# Patient Record
Sex: Male | Born: 1985 | Hispanic: Yes | Marital: Married | Smoking: Former smoker
Health system: Southern US, Community
[De-identification: ages and names within clinical notes are randomized; demographics above are authoritative.]

## PROBLEM LIST (undated history)

## (undated) DIAGNOSIS — E781 Pure hyperglyceridemia: Secondary | ICD-10-CM

---

## 2016-09-25 ENCOUNTER — Encounter (HOSPITAL_COMMUNITY)
Admission: EM | Disposition: A | Discharge status: Home / Self Care | Payer: Self-pay | Source: Home / Self Care | Attending: Emergency Medicine

## 2016-09-25 ENCOUNTER — Inpatient Hospital Stay (HOSPITAL_COMMUNITY): Payer: BLUE CROSS/BLUE SHIELD

## 2016-09-25 ENCOUNTER — Inpatient Hospital Stay (HOSPITAL_COMMUNITY): Payer: BLUE CROSS/BLUE SHIELD | Admitting: Anesthesiology

## 2016-09-25 ENCOUNTER — Emergency Department (HOSPITAL_COMMUNITY): Payer: BLUE CROSS/BLUE SHIELD

## 2016-09-25 ENCOUNTER — Encounter (HOSPITAL_COMMUNITY): Payer: Self-pay | Admitting: *Deleted

## 2016-09-25 ENCOUNTER — Observation Stay (HOSPITAL_COMMUNITY)
Admission: EM | Admit: 2016-09-25 | Discharge: 2016-09-26 | Disposition: A | Discharge status: Home / Self Care | Payer: BLUE CROSS/BLUE SHIELD | Attending: General Surgery | Admitting: General Surgery

## 2016-09-25 DIAGNOSIS — Z87891 Personal history of nicotine dependence: Secondary | ICD-10-CM | POA: Insufficient documentation

## 2016-09-25 DIAGNOSIS — E781 Pure hyperglyceridemia: Secondary | ICD-10-CM | POA: Diagnosis not present

## 2016-09-25 DIAGNOSIS — K3580 Unspecified acute appendicitis: Principal | ICD-10-CM | POA: Diagnosis present

## 2016-09-25 DIAGNOSIS — K353 Acute appendicitis with localized peritonitis, without perforation or gangrene: Secondary | ICD-10-CM

## 2016-09-25 DIAGNOSIS — J810 Acute pulmonary edema: Secondary | ICD-10-CM

## 2016-09-25 HISTORY — DX: Pure hyperglyceridemia: E78.1

## 2016-09-25 HISTORY — PX: LAPAROSCOPIC APPENDECTOMY: SHX408

## 2016-09-25 LAB — COMPREHENSIVE METABOLIC PANEL
ALBUMIN: 4.6 g/dL (ref 3.5–5.0)
ALT: 49 U/L (ref 17–63)
ANION GAP: 10 (ref 5–15)
AST: 76 U/L — AB (ref 15–41)
Alkaline Phosphatase: 59 U/L (ref 38–126)
BUN: 7 mg/dL (ref 6–20)
CO2: 24 mmol/L (ref 22–32)
Calcium: 9.6 mg/dL (ref 8.9–10.3)
Chloride: 102 mmol/L (ref 101–111)
Creatinine, Ser: 1.11 mg/dL (ref 0.61–1.24)
GFR calc Af Amer: >60 mL/min (ref >60)
Glucose, Bld: 126 mg/dL — ABNORMAL HIGH (ref 65–99)
POTASSIUM: 4.1 mmol/L (ref 3.5–5.1)
Sodium: 136 mmol/L (ref 135–145)
Total Bilirubin: 0.6 mg/dL (ref 0.3–1.2)
Total Protein: 7.6 g/dL (ref 6.5–8.1)

## 2016-09-25 LAB — LIPID PANEL
Cholesterol: 206 mg/dL — ABNORMAL HIGH (ref 0–200)
HDL: 48 mg/dL (ref >40)
LDL CALC: 124 mg/dL — AB (ref 0–99)
TRIGLYCERIDES: 169 mg/dL — AB (ref <150)
Total CHOL/HDL Ratio: 4.3 RATIO
VLDL: 34 mg/dL (ref 0–40)

## 2016-09-25 LAB — CBC
HEMATOCRIT: 42.9 % (ref 39.0–52.0)
HEMOGLOBIN: 13.8 g/dL (ref 13.0–17.0)
MCH: 25.8 pg — ABNORMAL LOW (ref 26.0–34.0)
MCHC: 32.2 g/dL (ref 30.0–36.0)
MCV: 80.2 fL (ref 78.0–100.0)
Platelets: 207 10*3/uL (ref 150–400)
RBC: 5.35 MIL/uL (ref 4.22–5.81)
RDW: 14.1 % (ref 11.5–15.5)
WBC: 11.6 10*3/uL — AB (ref 4.0–10.5)

## 2016-09-25 LAB — LIPASE, BLOOD: LIPASE: 18 U/L (ref 11–51)

## 2016-09-25 SURGERY — APPENDECTOMY, LAPAROSCOPIC
Anesthesia: General | Site: Abdomen

## 2016-09-25 MED ORDER — SODIUM CHLORIDE 0.9 % IR SOLN
Status: DC | PRN
  Administered 2016-09-25: 1

## 2016-09-25 MED ORDER — ONDANSETRON HCL 4 MG/2ML IJ SOLN
INTRAMUSCULAR | Status: DC | PRN
  Administered 2016-09-25: 4 mg via INTRAVENOUS

## 2016-09-25 MED ORDER — FENTANYL CITRATE (PF) 250 MCG/5ML IJ SOLN
INTRAMUSCULAR | Status: AC
  Filled 2016-09-25: qty 5

## 2016-09-25 MED ORDER — FENTANYL CITRATE (PF) 100 MCG/2ML IJ SOLN
INTRAMUSCULAR | Status: AC
  Filled 2016-09-25: qty 2

## 2016-09-25 MED ORDER — ROCURONIUM BROMIDE 100 MG/10ML IV SOLN
INTRAVENOUS | Status: DC | PRN
  Administered 2016-09-25: 40 mg via INTRAVENOUS

## 2016-09-25 MED ORDER — METRONIDAZOLE IN NACL 5-0.79 MG/ML-% IV SOLN
500.0000 mg | Freq: Three times a day (TID) | INTRAVENOUS | Status: DC
  Administered 2016-09-25 – 2016-09-26 (×3): 500 mg via INTRAVENOUS
  Filled 2016-09-25 (×4): qty 100

## 2016-09-25 MED ORDER — FENTANYL CITRATE (PF) 100 MCG/2ML IJ SOLN
25.0000 ug | INTRAMUSCULAR | Status: DC | PRN
  Administered 2016-09-25: 50 ug via INTRAVENOUS
  Administered 2016-09-25: 25 ug via INTRAVENOUS

## 2016-09-25 MED ORDER — SODIUM CHLORIDE 0.9 % IV BOLUS (SEPSIS)
1000.0000 mL | Freq: Once | INTRAVENOUS | Status: AC
  Administered 2016-09-25: 1000 mL via INTRAVENOUS

## 2016-09-25 MED ORDER — CEFTRIAXONE SODIUM 1 G IJ SOLR
1.0000 g | Freq: Once | INTRAMUSCULAR | Status: AC
  Administered 2016-09-25: 1 g via INTRAVENOUS
  Filled 2016-09-25: qty 10

## 2016-09-25 MED ORDER — SUGAMMADEX SODIUM 200 MG/2ML IV SOLN
INTRAVENOUS | Status: DC | PRN
  Administered 2016-09-25: 200 mg via INTRAVENOUS

## 2016-09-25 MED ORDER — MORPHINE SULFATE (PF) 4 MG/ML IV SOLN
4.0000 mg | Freq: Once | INTRAVENOUS | Status: AC
  Administered 2016-09-25: 4 mg via INTRAVENOUS
  Filled 2016-09-25: qty 1

## 2016-09-25 MED ORDER — ONDANSETRON HCL 4 MG/2ML IJ SOLN: 4.0000 mg | Freq: Once | INTRAMUSCULAR | Status: DC | PRN

## 2016-09-25 MED ORDER — ONDANSETRON HCL 4 MG/2ML IJ SOLN
4.0000 mg | Freq: Four times a day (QID) | INTRAMUSCULAR | Status: DC | PRN
  Filled 2016-09-25: qty 2

## 2016-09-25 MED ORDER — IOPAMIDOL (ISOVUE-300) INJECTION 61%
INTRAVENOUS | Status: AC
  Administered 2016-09-25: 100 mL
  Filled 2016-09-25: qty 100

## 2016-09-25 MED ORDER — PROPOFOL 10 MG/ML IV BOLUS
INTRAVENOUS | Status: DC | PRN
  Administered 2016-09-25: 200 mg via INTRAVENOUS

## 2016-09-25 MED ORDER — SUCCINYLCHOLINE CHLORIDE 20 MG/ML IJ SOLN
INTRAMUSCULAR | Status: DC | PRN
  Administered 2016-09-25: 20 mg via INTRAVENOUS
  Administered 2016-09-25: 120 mg via INTRAVENOUS

## 2016-09-25 MED ORDER — ALBUTEROL SULFATE (2.5 MG/3ML) 0.083% IN NEBU
2.5000 mg | INHALATION_SOLUTION | Freq: Four times a day (QID) | RESPIRATORY_TRACT | Status: DC | PRN
  Administered 2016-09-25: 2.5 mg via RESPIRATORY_TRACT

## 2016-09-25 MED ORDER — METRONIDAZOLE IN NACL 5-0.79 MG/ML-% IV SOLN
500.0000 mg | Freq: Once | INTRAVENOUS | Status: DC
  Filled 2016-09-25: qty 100

## 2016-09-25 MED ORDER — DEXTROSE-NACL 5-0.9 % IV SOLN
INTRAVENOUS | Status: DC
  Administered 2016-09-26: 04:00:00 via INTRAVENOUS

## 2016-09-25 MED ORDER — ALBUTEROL SULFATE (2.5 MG/3ML) 0.083% IN NEBU
INHALATION_SOLUTION | RESPIRATORY_TRACT | Status: AC
  Filled 2016-09-25: qty 3

## 2016-09-25 MED ORDER — BUPIVACAINE HCL (PF) 0.25 % IJ SOLN
INTRAMUSCULAR | Status: AC
Start: 2016-09-25 — End: 2016-09-25
  Filled 2016-09-25: qty 30

## 2016-09-25 MED ORDER — OXYCODONE HCL 5 MG PO TABS
5.0000 mg | ORAL_TABLET | ORAL | Status: DC | PRN
  Administered 2016-09-26: 10 mg via ORAL
  Filled 2016-09-25 (×2): qty 2

## 2016-09-25 MED ORDER — LACTATED RINGERS IV SOLN
INTRAVENOUS | Status: DC | PRN
  Administered 2016-09-25 (×2): via INTRAVENOUS

## 2016-09-25 MED ORDER — ONDANSETRON 4 MG PO TBDP
4.0000 mg | ORAL_TABLET | Freq: Four times a day (QID) | ORAL | Status: DC | PRN
  Filled 2016-09-25: qty 1

## 2016-09-25 MED ORDER — ALBUTEROL SULFATE (2.5 MG/3ML) 0.083% IN NEBU: 2.5000 mg | INHALATION_SOLUTION | Freq: Four times a day (QID) | RESPIRATORY_TRACT | Status: DC | PRN

## 2016-09-25 MED ORDER — BUPIVACAINE HCL 0.25 % IJ SOLN
INTRAMUSCULAR | Status: DC | PRN
  Administered 2016-09-25: 5 mL

## 2016-09-25 MED ORDER — FUROSEMIDE 10 MG/ML IJ SOLN
INTRAMUSCULAR | Status: AC
Start: 2016-09-25 — End: 2016-09-26
  Filled 2016-09-25: qty 4

## 2016-09-25 MED ORDER — 0.9 % SODIUM CHLORIDE (POUR BTL) OPTIME
TOPICAL | Status: DC | PRN
  Administered 2016-09-25: 1000 mL

## 2016-09-25 MED ORDER — FUROSEMIDE 10 MG/ML IJ SOLN
20.0000 mg | Freq: Once | INTRAMUSCULAR | Status: AC
  Administered 2016-09-25: 20 mg via INTRAVENOUS

## 2016-09-25 MED ORDER — MIDAZOLAM HCL 5 MG/5ML IJ SOLN
INTRAMUSCULAR | Status: DC | PRN
  Administered 2016-09-25: 2 mg via INTRAVENOUS

## 2016-09-25 MED ORDER — PROPOFOL 10 MG/ML IV BOLUS
INTRAVENOUS | Status: AC
  Filled 2016-09-25: qty 40

## 2016-09-25 MED ORDER — FENTANYL CITRATE (PF) 250 MCG/5ML IJ SOLN
INTRAMUSCULAR | Status: DC | PRN
  Administered 2016-09-25: 50 ug via INTRAVENOUS
  Administered 2016-09-25 (×2): 100 ug via INTRAVENOUS

## 2016-09-25 MED ORDER — ONDANSETRON HCL 4 MG/2ML IJ SOLN
4.0000 mg | Freq: Once | INTRAMUSCULAR | Status: AC
  Administered 2016-09-25: 4 mg via INTRAVENOUS
  Filled 2016-09-25: qty 2

## 2016-09-25 MED ORDER — MIDAZOLAM HCL 2 MG/2ML IJ SOLN
INTRAMUSCULAR | Status: AC
  Filled 2016-09-25: qty 2

## 2016-09-25 MED ORDER — HYDROMORPHONE HCL 1 MG/ML IJ SOLN
1.0000 mg | INTRAMUSCULAR | Status: DC | PRN
  Administered 2016-09-26: 1 mg via INTRAVENOUS
  Filled 2016-09-25: qty 1

## 2016-09-25 SURGICAL SUPPLY — 42 items
APPLIER CLIP 5 13 M/L LIGAMAX5 (MISCELLANEOUS)
BENZOIN TINCTURE PRP APPL 2/3 (GAUZE/BANDAGES/DRESSINGS) ×3 IMPLANT
BLADE CLIPPER SURG (BLADE) IMPLANT
CANISTER SUCT 3000ML PPV (MISCELLANEOUS) ×3 IMPLANT
CHLORAPREP W/TINT 26ML (MISCELLANEOUS) ×3 IMPLANT
CLIP APPLIE 5 13 M/L LIGAMAX5 (MISCELLANEOUS) IMPLANT
CLOSURE WOUND 1/2 X4 (GAUZE/BANDAGES/DRESSINGS) ×1
COVER SURGICAL LIGHT HANDLE (MISCELLANEOUS) ×3 IMPLANT
COVER TRANSDUCER ULTRASND (DRAPES) ×3 IMPLANT
DEVICE TROCAR PUNCTURE CLOSURE (ENDOMECHANICALS) ×3 IMPLANT
ELECT REM PT RETURN 9FT ADLT (ELECTROSURGICAL) ×3
ELECTRODE REM PT RTRN 9FT ADLT (ELECTROSURGICAL) ×1 IMPLANT
ENDOLOOP SUT PDS II  0 18 (SUTURE) ×6
ENDOLOOP SUT PDS II 0 18 (SUTURE) ×3 IMPLANT
GAUZE SPONGE 2X2 8PLY STRL LF (GAUZE/BANDAGES/DRESSINGS) ×1 IMPLANT
GLOVE BIO SURGEON STRL SZ7.5 (GLOVE) ×3 IMPLANT
GOWN STRL REUS W/ TWL LRG LVL3 (GOWN DISPOSABLE) ×2 IMPLANT
GOWN STRL REUS W/ TWL XL LVL3 (GOWN DISPOSABLE) ×1 IMPLANT
GOWN STRL REUS W/TWL LRG LVL3 (GOWN DISPOSABLE) ×4
GOWN STRL REUS W/TWL XL LVL3 (GOWN DISPOSABLE) ×2
KIT BASIN OR (CUSTOM PROCEDURE TRAY) ×3 IMPLANT
KIT ROOM TURNOVER OR (KITS) ×3 IMPLANT
NEEDLE INSUFFLATION 14GA 120MM (NEEDLE) ×3 IMPLANT
NS IRRIG 1000ML POUR BTL (IV SOLUTION) ×3 IMPLANT
PAD ARMBOARD 7.5X6 YLW CONV (MISCELLANEOUS) ×6 IMPLANT
POUCH RETRIEVAL ECOSAC 10 (ENDOMECHANICALS) ×1 IMPLANT
POUCH RETRIEVAL ECOSAC 10MM (ENDOMECHANICALS) ×2
SCISSORS LAP 5X35 DISP (ENDOMECHANICALS) ×3 IMPLANT
SET IRRIG TUBING LAPAROSCOPIC (IRRIGATION / IRRIGATOR) ×3 IMPLANT
SLEEVE ENDOPATH XCEL 5M (ENDOMECHANICALS) ×3 IMPLANT
SPECIMEN JAR SMALL (MISCELLANEOUS) ×3 IMPLANT
SPONGE GAUZE 2X2 STER 10/PKG (GAUZE/BANDAGES/DRESSINGS) ×2
STRIP CLOSURE SKIN 1/2X4 (GAUZE/BANDAGES/DRESSINGS) ×2 IMPLANT
SUT MNCRL AB 4-0 PS2 18 (SUTURE) ×3 IMPLANT
TAPE CLOTH SURG 4X10 WHT LF (GAUZE/BANDAGES/DRESSINGS) ×3 IMPLANT
TOWEL OR 17X24 6PK STRL BLUE (TOWEL DISPOSABLE) ×3 IMPLANT
TOWEL OR 17X26 10 PK STRL BLUE (TOWEL DISPOSABLE) ×3 IMPLANT
TRAY FOLEY CATH SILVER 16FR (SET/KITS/TRAYS/PACK) ×3 IMPLANT
TRAY LAPAROSCOPIC MC (CUSTOM PROCEDURE TRAY) ×3 IMPLANT
TROCAR XCEL NON-BLD 11X100MML (ENDOMECHANICALS) ×3 IMPLANT
TROCAR XCEL NON-BLD 5MMX100MML (ENDOMECHANICALS) ×3 IMPLANT
TUBING INSUF HEATED (TUBING) ×3 IMPLANT

## 2016-09-25 NOTE — ED Notes (Addendum)
Pt belongings and valuables given to family.

## 2016-09-25 NOTE — Progress Notes (Signed)
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 Anesthetic induction and surgery were uneventful but upon emergence from anesthesia he was extubated and developed laryngospasm. He was given 40 mg of IV succinylcholine and was given mask ventilation and  not reintubated.  In the recovery room he was noted to have bilateral crackles oxygen saturation 94% on 10 L facemask. A chest x-ray revealed bilateral pulmonary edema. He was given 20 mg of IV Lasix and an albuterol hand-held nebulizer treatment with improvement in his respiratory parameters.  Complaint of cough which is nonproductive but denies shortness of breath.  Vital signs: T-36.7 BP- 104/84 HR- 110 RR- 20 )2 Sat 100% on 2L [place]  Lungs- Few scattered crackles bilaterally Heart- RRR no Murmurs no S3  Impression: 31 year old S/P appendectomy complicated by post-extubation laryngospasm and bilaterally pulmonary edema. Now improved with 20 mg IV lasix.  Plan 1. Telemetry 2. Albuterol HHN Q6 3, Incentive spirometry  Kipp Brood

## 2016-09-25 NOTE — ED Provider Notes (Signed)
[facility]-EMERGENCY DEPT Provider Note   CSN: [id number] Arrival date & time: 09/25/16  1529     History   Chief Complaint Chief Complaint  Patient presents with  . Abdominal Pain    HPI Douglas Buckley is a 31 y.o. male hx of hypertriglyceridemia here with epigastric, periumbilical pain. Patient has severe epigastric and periumbilical pain that is constant for 2 days. Associated with nausea but no vomiting. Still passing gas, normal bowel movement today. Denies any urinary symptoms or fever. Denies alcohol use or previous hx of pancreatitis. No abdominal surgeries in the past. 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   The history is provided by the patient.    Past Medical History:  Diagnosis Date  . Hypertriglyceridemia     There are no active problems to display for this patient.   History reviewed. No pertinent surgical history.     Home Medications    Prior to Admission medications   Not on File    Family History No family history on file.  Social History Social History  Substance Use Topics  . Smoking status: Former Games developer  . Smokeless tobacco: Never Used  . Alcohol use Not on file     Allergies   Patient has no known allergies.   Review of Systems Review of Systems  Gastrointestinal: Positive for abdominal pain and nausea.  All other systems reviewed and are negative.    Physical Exam Updated Vital Signs BP 97/68   Pulse (!) 59   Temp 97.8 F (36.6 C) (Oral)   Resp 10   SpO2 100%   Physical Exam  Constitutional: He is oriented to person, place, and time.  Uncomfortable, dehydrated, nauseated   HENT:  Head: Normocephalic.  MM dry   Eyes: EOM are normal. Pupils are equal, round, and reactive to light.  Neck: Normal range of motion. Neck supple.  Cardiovascular: Normal rate, regular rhythm and normal heart sounds.   Pulmonary/Chest: Effort normal and breath sounds normal. No respiratory distress. He has no wheezes. He has no rales.    Abdominal: Soft. Bowel sounds are normal.  + epigastric and periumbilical and mild RUQ tenderness, no rebound.   Musculoskeletal: Normal range of motion.  Neurological: He is alert and oriented to person, place, and time. He displays normal reflexes. No cranial nerve deficit. Coordination normal.  Skin: Skin is warm.  Psychiatric: He has a normal mood and affect.  Nursing note and vitals reviewed.    ED Treatments / Results  Labs (all labs ordered are listed, but only abnormal results are displayed) Labs Reviewed  COMPREHENSIVE METABOLIC PANEL - Abnormal; Notable for the following:       Result Value   Glucose, Bld 126 (*)    AST 76 (*)    All other components within normal limits  CBC - Abnormal; Notable for the following:    WBC 11.6 (*)    MCH 25.8 (*)    All other components within normal limits  LIPID PANEL - Abnormal; Notable for the following:    Cholesterol 206 (*)    Triglycerides 169 (*)    LDL Cholesterol 124 (*)    All other components within normal limits  LIPASE, BLOOD  URINALYSIS, ROUTINE W REFLEX MICROSCOPIC    EKG  EKG Interpretation None       Radiology Ct Abdomen Pelvis W Contrast  Result Date: 09/25/2016 CLINICAL DATA:  Vomiting diarrhea and chills for the past 2 days. Evaluate for appendicitis. EXAM: CT ABDOMEN AND PELVIS WITH  CONTRAST TECHNIQUE: Multidetector CT imaging of the abdomen and pelvis was performed using the standard protocol following bolus administration of intravenous contrast. CONTRAST:  ISOVUE-300 IOPAMIDOL (ISOVUE-300) INJECTION 61% COMPARISON:  None. FINDINGS: Lower chest: Limited visualization of the lower thorax is negative for focal airspace opacity or pleural effusion. Normal heart size.  No pericardial effusion. Hepatobiliary: Normal hepatic contour. There is a minimal amount of focal fatty infiltration adjacent to the fissure for ligamentum teres. No discrete hepatic lesions. Normal appearance of the gallbladder given  degree distention. No intra extrahepatic bili duct dilatation. No ascites. Pancreas: Normal appearance of the pancreas Spleen: Normal appearance of the spleen Adrenals/Urinary Tract: There is symmetric enhancement of the bilateral kidneys. No definite renal stones this postcontrast examination. No discrete renal lesions. No urine obstruction or perinephric stranding. Normal appearance of the bilateral adrenal glands. Normal appearance of the urinary bladder given degree distention. Stomach/Bowel: There is a punctate (approximately 1 cm) phlebolith lodged within the base of the appendix (coronal image 47, series 5) with associated downstream appendiceal dilatation measuring up to 1.7 cm in diameter (image 47, series 3). This finding is associated with a very minimal amount of adjacent peri appendiceal stranding. No evidence of perforation or definable/drainable fluid collection. Moderate colonic stool burden without evidence of enteric obstruction. Normal appearance of the terminal ileum. No pneumoperitoneum, pneumatosis or portal venous gas. Vascular/Lymphatic: Normal caliber of the abdominal aorta. The major branch vessels of the abdominal aorta appear patent on this non CTA examination. 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 No bulky retroperitoneal, mesenteric, pelvic or inguinal lymphadenopathy. Reproductive: Normal appearance of the prostate gland. No free fluid in the pelvic cul-de-sac. Other: Regional soft tissues appear normal. Musculoskeletal: No acute or aggressive osseous abnormalities. IMPRESSION: Findings compatible with acute uncomplicated appendicitis secondary to a phlebolith lodged within the base of the appendix. No evidence of perforation or definable / drainable fluid collection. Electronically Signed   By: Simonne Come M.D.   On: 09/25/2016 17:46    Procedures Procedures (including critical care time)  Medications Ordered in ED Medications  sodium chloride 0.9 % bolus 1,000  mL (1,000 mLs Intravenous New Bag/Given 09/25/16 1657)  ondansetron (ZOFRAN) injection 4 mg (4 mg Intravenous Given 09/25/16 1657)  morphine 4 MG/ML injection 4 mg (4 mg Intravenous Given 09/25/16 1659)  iopamidol (ISOVUE-300) 61 % injection (100 mLs  Contrast Given 09/25/16 1727)     Initial Impression / Assessment and Plan / ED Course  I have reviewed the triage vital signs and the nursing notes.  Pertinent labs & imaging results that were available during my care of the patient were reviewed by me and considered in my medical decision making (see chart for details).     Douglas Buckley is a 31 y.o. male here with abdominal pain. Hx of triglyceridemia and is at risk for pancreatitis. Will get labs, lipase, UA, CT ab/pel. Will give pain meds, zofran and IVF and reassess.   6:00 PM CT showed acute appendicitis with no rupture. I called Dr. Derrell Lolling from surgery, who will see patient and admit for appendectomy.    Final Clinical Impressions(s) / ED Diagnoses   Final diagnoses:  None    New Prescriptions New Prescriptions   No medications on file     Charlynne Pander, MD 09/25/16 1801

## 2016-09-25 NOTE — ED Notes (Signed)
Patient taken to CT.

## 2016-09-25 NOTE — ED Triage Notes (Signed)
Pt reports central abdominal pain that started 2 days ago. Pt reports nausea without vomiting. Some tenderness on palpation reported.

## 2016-09-25 NOTE — Op Note (Signed)
09/25/2016  9:04 PM  PATIENT:  Douglas Buckley  31 y.o. male  PRE-OPERATIVE DIAGNOSIS:  Acute appendicits  POST-OPERATIVE DIAGNOSIS:   Acute, non perforated appendicits  PROCEDURE:  Procedure(s): APPENDECTOMY LAPAROSCOPIC (N/A)  SURGEON:  Surgeon(s) and Role:    * Axel Filler, MD - Primary  ANESTHESIA:   local and general  EBL:  Total I/O In: 1000 [I.V.:1000] Out: 725 [Urine:700; Blood:25]  BLOOD ADMINISTERED:none  DRAINS: none   LOCAL MEDICATIONS USED:  BUPIVICAINE   SPECIMEN:  Source of Specimen:  Appendix  DISPOSITION OF SPECIMEN:  PATHOLOGY  COUNTS:  YES  TOURNIQUET:  * No tourniquets in log *  DICTATION: .Dragon Dictation Complications: none  Counts: reported as correct x 2  Findings:  The patient had a acutely inflammed appendix  Specimen: Appendix  Indications for procedure:  The patient is a 31 year-old male with a history of periumbilical pain. The patient had a CT scan which revealed signs consistent with acute appendicitis the patient back in for laparoscopic appendectomy.  Details of the procedure:The patient was taken back to the operating room. The patient was placed in supine position with bilateral SCDs in place.  A foley catheter was place. The patient was prepped and draped in the usual sterile fashion.  After appropriate anitbiotics were confirmed, a time-out was confirmed and all facts were verified.    A pneumoperitoneum of 14 mmHg was obtained via a Veress needle technique in the left lower quadrant quadrant.  A 5 mm trocar and 5 mm camera then placed intra-abdominally there is no injury to any intra-abdominal organs a 10 mm infraumbilical port was placed and direct visualization as was a 5 mm port in the suprapubic area.   The appendix was identified and seen to be non-perforated.  The appendix was cleaned down to the appendiceal base. The mesoappendix was then incised and the appendiceal artery was cauterized.  The the appendiceal base was  clean.  At this time an Endoloop was placed proximallyx2 and one distally and the appendix was transected between these 2. A retrieval bag was then placed into the abdomen and the specimen placed in the bag. The appendiceal stump was cauterized. We evacuate the fluid from the pelvis until the effluent was clear.  The appendix and retrieval  bag was then retrieved via the supraumbilical port. #1 Vicryl was used to reapproximate the fascia at the umbilical port site x2. The skin was reapproximated all port sites 3-0 Monocryl subcuticular fashion. The skin was dressed with steri-strips, guaze, and tape.  The patient had the foley removed. The patient was awakened from general anesthesia was taken to recovery room in stable condition.      PLAN OF CARE: Admit for overnight observation  PATIENT DISPOSITION:  PACU - hemodynamically stable.   Delay start of Pharmacological VTE agent (>24hrs) due to surgical blood loss or risk of bleeding: not applicable

## 2016-09-25 NOTE — Transfer of Care (Signed)
Immediate Anesthesia Transfer of Care Note  Patient: Douglas Buckley  Procedure(s) Performed: Procedure(s): APPENDECTOMY LAPAROSCOPIC (N/A)  Patient Location: PACU  Anesthesia Type:General  Level of Consciousness: sedated  Airway & Oxygen Therapy: Patient Spontanous Breathing and Patient connected to face mask oxygen  Post-op Assessment: Report given to RN and Post -op Vital signs reviewed and stable  Post vital signs: Reviewed and stable  Last Vitals:  Vitals:   09/25/16 1900 09/25/16 2143  BP: 119/86 130/77  Pulse: (!) 58 (!) 112  Resp: 10 (!) 26  Temp:  36.2 C    Last Pain:  Vitals:   09/25/16 1849  TempSrc:   PainSc: 6          Complications: No apparent anesthesia complications

## 2016-09-25 NOTE — Anesthesia Procedure Notes (Signed)
Procedure Name: Intubation Date/Time: 09/25/2016 8:17 PM Performed by: Brien Mates D Pre-anesthesia Checklist: Patient identified, Emergency Drugs available, Suction available, Patient being monitored and Timeout performed Patient Re-evaluated:Patient Re-evaluated prior to inductionOxygen Delivery Method: Circle system utilized Preoxygenation: Pre-oxygenation with 100% oxygen Intubation Type: IV induction, Rapid sequence and Cricoid Pressure applied Laryngoscope Size: Miller and 2 Grade View: Grade I Tube type: Oral Tube size: 7.5 mm Number of attempts: 1 Airway Equipment and Method: Stylet Placement Confirmation: ETT inserted through vocal cords under direct vision,  positive ETCO2 and breath sounds checked- equal and bilateral Secured at: 22 cm Tube secured with: Tape Dental Injury: Teeth and Oropharynx as per pre-operative assessment

## 2016-09-25 NOTE — ED Notes (Signed)
Pt returned from CT °

## 2016-09-25 NOTE — H&P (Signed)
Douglas Buckley is an 31 y.o. male.   Chief Complaint: abdominal pain HPI: Douglas Buckley with abd pain x 2d.  + n/v - diarrhea.  Pt states pain began at periumbilical area and is located there currently.  Pt with no radiation of pain currently.    Past Medical History:  Diagnosis Date  . Hypertriglyceridemia     History reviewed. No pertinent surgical history.  No family history on file. Social History:  reports that he has quit smoking. He has never used smokeless tobacco. His alcohol and drug histories are not on file.  Allergies: No Known Allergies   (Not in a hospital admission)  Results for orders placed or performed during the hospital encounter of 09/25/16 (from the past 48 hour(s))  Lipase, blood     Status: None   Collection Time: 09/25/16  4:19 PM  Result Value Ref Range   Lipase 18 11 - 51 U/L  Comprehensive metabolic panel     Status: Abnormal   Collection Time: 09/25/16  4:19 PM  Result Value Ref Range   Sodium 136 135 - 145 mmol/L   Potassium 4.1 3.5 - 5.1 mmol/L   Chloride 102 101 - 111 mmol/L   CO2 24 22 - 32 mmol/L   Glucose, Bld 126 (H) 65 - 99 mg/dL   BUN 7 6 - 20 mg/dL   Creatinine, Ser 1.11 0.61 - 1.24 mg/dL   Calcium 9.6 8.9 - 10.3 mg/dL   Total Protein 7.6 6.5 - 8.1 g/dL   Albumin 4.6 3.5 - 5.0 g/dL   AST 76 (H) 15 - 41 U/L   ALT 49 Douglas - 63 U/L   Alkaline Phosphatase 59 38 - 126 U/L   Total Bilirubin 0.6 0.3 - 1.2 mg/dL   GFR calc non Af Amer >60 >60 mL/min   GFR calc Af Amer >60 >60 mL/min    Comment: (NOTE) The eGFR has been calculated using the CKD EPI equation. This calculation has not been validated in all clinical situations. eGFR's persistently <60 mL/min signify possible Chronic Kidney Disease.    Anion gap 10 5 - 15  CBC     Status: Abnormal   Collection Time: 09/25/16  4:19 PM  Result Value Ref Range   WBC 11.6 (H) 4.0 - 10.5 K/uL   RBC 5.35 4.22 - 5.81 MIL/uL   Hemoglobin 13.8 13.0 - Douglas.0 g/dL   HCT 42.9 39.0 - 52.0 %   MCV 80.2 78.0  - 100.0 fL   MCH 25.8 (L) 26.0 - 34.0 pg   MCHC 32.2 30.0 - 36.0 g/dL   RDW 14.1 11.5 - 15.5 %   Platelets 207 150 - 400 K/uL  Lipid panel     Status: Abnormal   Collection Time: 09/25/16  5:13 PM  Result Value Ref Range   Cholesterol 206 (H) 0 - 200 mg/dL   Triglycerides 169 (H) <150 mg/dL   HDL 48 >40 mg/dL   Total CHOL/HDL Ratio 4.3 RATIO   VLDL 34 0 - 40 mg/dL   LDL Cholesterol 124 (H) 0 - 99 mg/dL    Comment:        Total Cholesterol/HDL:CHD Risk Coronary Heart Disease Risk Table                     Men   Women  1/2 Average Risk   3.4   3.3  Average Risk       5.0   4.4  2 X Average Risk  9.6   7.1  3 X Average Risk  23.4   11.0        Use the calculated Patient Ratio above and the CHD Risk Table to determine the patient's CHD Risk.        ATP III CLASSIFICATION (LDL):  <100     mg/dL   Optimal  633-019  mg/dL   Near or Above                    Optimal  130-159  mg/dL   Borderline  736-149  mg/dL   High  >298     mg/dL   Very High    Ct Abdomen Pelvis W Contrast  Result Date: 09/25/2016 CLINICAL DATA:  Vomiting diarrhea and chills for the past 2 days. Evaluate for appendicitis. EXAM: CT ABDOMEN AND PELVIS WITH CONTRAST TECHNIQUE: Multidetector CT imaging of the abdomen and pelvis was performed using the standard protocol following bolus administration of intravenous contrast. CONTRAST:  ISOVUE-300 IOPAMIDOL (ISOVUE-300) INJECTION 61% COMPARISON:  None. FINDINGS: Lower chest: Limited visualization of the lower thorax is negative for focal airspace opacity or pleural effusion. Normal heart size.  No pericardial effusion. Hepatobiliary: Normal hepatic contour. There is a minimal amount of focal fatty infiltration adjacent to the fissure for ligamentum teres. No discrete hepatic lesions. Normal appearance of the gallbladder given degree distention. No intra extrahepatic bili duct dilatation. No ascites. Pancreas: Normal appearance of the pancreas Spleen: Normal  appearance of the spleen Adrenals/Urinary Tract: There is symmetric enhancement of the bilateral kidneys. No definite renal stones this postcontrast examination. No discrete renal lesions. No urine obstruction or perinephric stranding. Normal appearance of the bilateral adrenal glands. Normal appearance of the urinary bladder given degree distention. Stomach/Bowel: There is a punctate (approximately 1 cm) phlebolith lodged within the base of the appendix (coronal image 47, series 5) with associated downstream appendiceal dilatation measuring up to 1.7 cm in diameter (image 47, series 3). This finding is associated with a very minimal amount of adjacent peri appendiceal stranding. No evidence of perforation or definable/drainable fluid collection. Moderate colonic stool burden without evidence of enteric obstruction. Normal appearance of the terminal ileum. No pneumoperitoneum, pneumatosis or portal venous gas. Vascular/Lymphatic: Normal caliber of the abdominal aorta. The major branch vessels of the abdominal aorta appear patent on this non CTA examination. 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 No bulky retroperitoneal, mesenteric, pelvic or inguinal lymphadenopathy. Reproductive: Normal appearance of the prostate gland. No free fluid in the pelvic cul-de-sac. Other: Regional soft tissues appear normal. Musculoskeletal: No acute or aggressive osseous abnormalities. IMPRESSION: Findings compatible with acute uncomplicated appendicitis secondary to a phlebolith lodged within the base of the appendix. No evidence of perforation or definable / drainable fluid collection. Electronically Signed   By: Simonne Come Buckley.D.   On: 09/25/2016 Douglas:46    Review of Systems  Constitutional: Negative for chills, fever and weight loss.  HENT: Negative for ear discharge, ear pain, hearing loss and tinnitus.   Eyes: Negative for blurred vision, double vision and photophobia.  Respiratory: Negative for cough,  hemoptysis and sputum production.   Cardiovascular: Negative for chest pain, palpitations and orthopnea.  Gastrointestinal: Positive for abdominal pain, nausea and vomiting. Negative for diarrhea and heartburn.  Musculoskeletal: Negative for back pain, myalgias and neck pain.  Neurological: Negative for dizziness, tingling, tremors and headaches.  All other systems reviewed and are negative.   Blood pressure (!) 124/94, pulse (!) 58, temperature 97.8 F (36.6  C), temperature source Oral, resp. rate 11, SpO2 100 %. Physical Exam  Constitutional: He is oriented to person, place, and time. He appears well-developed and well-nourished. He appears distressed.  HENT:  Head: Normocephalic and atraumatic.  Right Ear: External ear normal.  Left Ear: External ear normal.  Eyes: Conjunctivae and EOM are normal. Pupils are equal, round, and reactive to light. Right eye exhibits no discharge. Left eye exhibits no discharge. No scleral icterus.  Neck: Normal range of motion. Neck supple. No JVD present. No tracheal deviation present. No thyromegaly present.  Cardiovascular: Normal rate, regular rhythm, normal heart sounds and intact distal pulses.  Exam reveals no gallop and no friction rub.   No murmur heard. Respiratory: Effort normal and breath sounds normal. No stridor. No respiratory distress. He has no wheezes. He has no rales. He exhibits no tenderness.  GI: Soft. Bowel sounds are normal. He exhibits no distension and no mass. There is no tenderness. There is no rebound and no guarding.  Musculoskeletal: Normal range of motion. He exhibits no edema, tenderness or deformity.  Lymphadenopathy:    He has no cervical adenopathy.  Neurological: He is alert and oriented to person, place, and time.  Skin: Skin is warm and dry. No rash noted. He is not diaphoretic. No erythema. No pallor.     Assessment/Plan 31 y/o Buckley with acute appendicitis. 1. Admit to hospital 2. IV abx 3. To OR for lap appy.  I  discussed with the patient the risks benefits of the procedure to include but not limited to: Infection, bleeding, damage to surrounding structures, possible ileus, possible postoperative infection. Patient voiced understanding and wishes to proceed.   Reyes Ivan, MD 09/25/2016, 6:40 PM

## 2016-09-25 NOTE — Anesthesia Postprocedure Evaluation (Addendum)
Anesthesia Post Note  Patient: Douglas Buckley  Procedure(s) Performed: Procedure(s) (LRB): APPENDECTOMY LAPAROSCOPIC (N/A)  Patient location during evaluation: PACU Anesthesia Type: General Level of consciousness: awake, awake and alert and oriented Pain management: pain level controlled Vital Signs Assessment: post-procedure vital signs reviewed and stable Respiratory status: spontaneous breathing, nonlabored ventilation and patient connected to nasal cannula oxygen Cardiovascular status: blood pressure returned to baseline Anesthetic complications: yes Anesthetic complication details: respiratory eventComments: See progress notes       Last Vitals:  Vitals:   09/25/16 2300 09/25/16 2315  BP: 104/84 115/70  Pulse: (!) 110 (!) 120  Resp: 20 16  Temp: 36.7 C     Last Pain:  Vitals:   09/25/16 2300  TempSrc:   PainSc: Asleep                 Sharia Averitt COKER

## 2016-09-25 NOTE — Anesthesia Preprocedure Evaluation (Addendum)
Anesthesia Evaluation  Patient identified by MRN, date of birth, ID band Patient awake    Reviewed: Allergy & Precautions, NPO status , Patient's Chart, lab work & pertinent test results  Airway Mallampati: II  TM Distance: >3 FB Neck ROM: Full    Dental  (+) Teeth Intact, Dental Advisory Given   Pulmonary former smoker,    breath sounds clear to auscultation       Cardiovascular  Rhythm:Regular Rate:Normal     Neuro/Psych    GI/Hepatic   Endo/Other    Renal/GU      Musculoskeletal   Abdominal   Peds  Hematology   Anesthesia Other Findings   Reproductive/Obstetrics                             Anesthesia Physical Anesthesia Plan  ASA: II and emergent  Anesthesia Plan: General   Post-op Pain Management:    Induction: Intravenous, Rapid sequence and Cricoid pressure planned  Airway Management Planned: Oral ETT  Additional Equipment:   Intra-op Plan:   Post-operative Plan: Extubation in OR  Informed Consent: I have reviewed the patients History and Physical, chart, labs and discussed the procedure including the risks, benefits and alternatives for the proposed anesthesia with the patient or authorized representative who has indicated his/her understanding and acceptance.   Dental advisory given  Plan Discussed with: CRNA, Anesthesiologist and Surgeon  Anesthesia Plan Comments:        Anesthesia Quick Evaluation

## 2016-09-26 ENCOUNTER — Encounter (HOSPITAL_COMMUNITY): Payer: Self-pay | Admitting: General Surgery

## 2016-09-26 LAB — URINALYSIS, ROUTINE W REFLEX MICROSCOPIC
BILIRUBIN URINE: NEGATIVE
GLUCOSE, UA: NEGATIVE mg/dL
HGB URINE DIPSTICK: NEGATIVE
Ketones, ur: NEGATIVE mg/dL
Leukocytes, UA: NEGATIVE
NITRITE: NEGATIVE
PH: 6 (ref 5.0–8.0)
Protein, ur: NEGATIVE mg/dL
SPECIFIC GRAVITY, URINE: 1.009 (ref 1.005–1.030)

## 2016-09-26 MED ORDER — ACETAMINOPHEN 325 MG PO TABS: 650.0000 mg | ORAL_TABLET | Freq: Four times a day (QID) | ORAL | Status: DC | PRN

## 2016-09-26 MED ORDER — OXYCODONE HCL 5 MG PO TABS: 5.0000 mg | ORAL_TABLET | Freq: Four times a day (QID) | ORAL | 0 refills | Status: AC | PRN

## 2016-09-26 MED ORDER — ACETAMINOPHEN 325 MG PO TABS: 650.0000 mg | ORAL_TABLET | Freq: Four times a day (QID) | ORAL | Status: AC | PRN

## 2016-09-26 NOTE — Care Management Note (Signed)
No neds identified.Case Management Note  Patient Details  Name: Douglas Buckley MRN: [id number] Date of Birth: 11/20/1985  Subjective/Objective:                    Action/Plan:   Expected Discharge Date:  09/26/16               Expected Discharge Plan:  Home/Self Care  In-House Referral:     Discharge planning Services     Post Acute Care Choice:    Choice offered to:     DME Arranged:    DME Agency:     HH Arranged:    HH Agency:     Status of Service:  Completed, signed off  If discussed at [employer] of Stay Meetings, dates discussed:    Additional Comments:  Kingsley Plan, RN 09/26/2016, 10:44 AM

## 2016-09-26 NOTE — Addendum Note (Signed)
Addendum  created 09/26/16 0855 by Kipp Brood, MD   Sign clinical note

## 2016-09-26 NOTE — Progress Notes (Signed)
Pellegrino Claros to be D/C'd  per MD order. Discussed with the patient and all questions fully answered.  VSS, Skin clean, dry and intact without evidence of skin break down, no evidence of skin tears noted.  IV catheter discontinued intact. Site without signs and symptoms of complications. Dressing and pressure applied.  An After Visit Summary was printed and given to the patient. Patient received prescription.  D/c education completed with patient/family including follow up instructions, medication list, d/c activities limitations if indicated, with other d/c instructions as indicated by MD - patient able to verbalize understanding, all questions fully answered.   Patient instructed to return to ED, call 911, or call MD for any changes in condition.   Patient declined WC transport wanted to walk out, and D/C home via private auto.

## 2016-09-26 NOTE — Discharge Summary (Signed)
[facility] Discharge Summary   Patient ID: Douglas Buckley MRN: [id number] DOB/AGE: Dec 24, 1985 31 y.o.  Admit date: 09/25/2016 Discharge date: 09/26/2016   Discharge Diagnosis Patient Active Problem List   Diagnosis Date Noted  . Acute appendicitis 09/25/2016    Imaging: Ct Abdomen Pelvis W Contrast  Result Date: 09/25/2016 CLINICAL DATA:  Vomiting diarrhea and chills for the past 2 days. Evaluate for appendicitis. EXAM: CT ABDOMEN AND PELVIS WITH CONTRAST TECHNIQUE: Multidetector CT imaging of the abdomen and pelvis was performed using the standard protocol following bolus administration of intravenous contrast. CONTRAST:  ISOVUE-300 IOPAMIDOL (ISOVUE-300) INJECTION 61% COMPARISON:  None. FINDINGS: Lower chest: Limited visualization of the lower thorax is negative for focal airspace opacity or pleural effusion. Normal heart size.  No pericardial effusion. Hepatobiliary: Normal hepatic contour. There is a minimal amount of focal fatty infiltration adjacent to the fissure for ligamentum teres. No discrete hepatic lesions. Normal appearance of the gallbladder given degree distention. No intra extrahepatic bili duct dilatation. No ascites. Pancreas: Normal appearance of the pancreas Spleen: Normal appearance of the spleen Adrenals/Urinary Tract: There is symmetric enhancement of the bilateral kidneys. No definite renal stones this postcontrast examination. No discrete renal lesions. No urine obstruction or perinephric stranding. Normal appearance of the bilateral adrenal glands. Normal appearance of the urinary bladder given degree distention. Stomach/Bowel: There is a punctate (approximately 1 cm) phlebolith lodged within the base of the appendix (coronal image 47, series 5) with associated downstream appendiceal dilatation measuring up to 1.7 cm in diameter (image 47, series 3). This finding is associated with a very minimal amount of adjacent peri appendiceal stranding. No evidence  of perforation or definable/drainable fluid collection. Moderate colonic stool burden without evidence of enteric obstruction. Normal appearance of the terminal ileum. No pneumoperitoneum, pneumatosis or portal venous gas. Vascular/Lymphatic: Normal caliber of the abdominal aorta. The major branch vessels of the abdominal aorta appear patent on this non CTA examination. 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 No bulky retroperitoneal, mesenteric, pelvic or inguinal lymphadenopathy. Reproductive: Normal appearance of the prostate gland. No free fluid in the pelvic cul-de-sac. Other: Regional soft tissues appear normal. Musculoskeletal: No acute or aggressive osseous abnormalities. IMPRESSION: Findings compatible with acute uncomplicated appendicitis secondary to a phlebolith lodged within the base of the appendix. No evidence of perforation or definable / drainable fluid collection. Electronically Signed   By: Simonne Come M.D.   On: 09/25/2016 17:46   Dg Chest Port 1 View  Result Date: 09/25/2016 CLINICAL DATA:  Pulmonary edema postop EXAM: PORTABLE CHEST 1 VIEW COMPARISON:  None. FINDINGS: The heart size and mediastinal contours are within normal limits. Pulmonary vascular redistribution consistent pulmonary edema. Slightly more confluent airspace opacity at the right lung apex cannot exclude superimposed pneumonia. No acute osseous appearing abnormality. IMPRESSION: Diffuse mild-to-moderate pulmonary edema. More confluent pulmonary consolidation at the right lung apex cannot exclude pneumonia. Critical Value/emergent results were called by telephone at the time of interpretation on 09/25/2016 at 10:56 pm to Dr. Kipp Brood , who verbally acknowledged these results. Electronically Signed   By: Tollie Eth M.D.   On: 09/25/2016 22:56    Procedures Dr. Axel Filler (09/25/16) - Laparoscopic Appendectomy  Hospital Course:  31 year old male who presented to [facility] with 2 days of periumbilical  abdominal pain associated with nausea/vomiting.  Workup significant for acute appendicitis and patient underwent the above procedure. Was admitted to the hospital overnight and on POD#1 patients pain was controlled, vitals stable, mobilizing, tolerating  PO and clinically stable for discharge. He will follow up at our office as below   I have personally reviewed the patients medication history on the [place] controlled substance database.  Physical Exam: General:  Alert, NAD, pleasant, comfortable Pulm: normal effort, CTAB Abd:  Soft, ND, mild tenderness, incisions C/D/I  Allergies as of 09/26/2016   No Known Allergies     Medication List    TAKE these medications   acetaminophen 325 MG tablet Commonly known as:  TYLENOL Take 2 tablets (650 mg total) by mouth every 6 (six) hours as needed for moderate pain. Notes to patient:  As Needed   NEXIUM PO Take 1 capsule by mouth once. Notes to patient:  If taking at home, can restart 4/25 AM   oxyCODONE 5 MG immediate release tablet Commonly known as:  Oxy IR/ROXICODONE Take 1-2 tablets (5-10 mg total) by mouth every 6 (six) hours as needed for moderate pain. Notes to patient:  As needed   ROLAIDS PO Take 2 tablets by mouth 2 (two) times daily as needed (indigestion).   Vitamin D 2000 units tablet Take 2,000 Units by mouth daily.        Follow-up Information    [facility], [place]. Schedule an appointment as soon as possible for a visit on 10/17/2016.   Specialty:  General Surgery Why:  at 2:30 PM for post-operative follow up. Contact information: [address] [place] [address] [phone]          Signed: Hosie Spangle, [facility] 09/26/2016, 1:52 PM Pager: [phone] Consults: [phone] Mon-Fri 7:00 am-4:30 pm Sat-Sun 7:00 am-11:30 am

## 2016-09-26 NOTE — Progress Notes (Signed)
Anesthesiology Follow-up:  Awake and alert, breathing comfortably, had cough last night, now resolved. Using incentive spirometry.  VS: T- 37.2 BP 96/50 RR-17 HR-105 O2 sat 100% on 64L [place]  31 year old male one day S/P  Lap appendectomy complicated by post- obstructive pulmonary edema. Now clinically improved, should be OK for discharge.  Douglas Buckley

## 2016-09-26 NOTE — Discharge Instructions (Signed)

## 2017-01-25 NOTE — Addendum Note (Signed)
Addendum  created 01/25/17 1329 by Kipp Brood, MD   Sign clinical note

## 2018-04-20 IMAGING — CR DG CHEST 1V PORT
1 series · 1 of 1 positions shown · non-contrast
Comparison: None.

CLINICAL DATA: Pulmonary edema postop

EXAM:
PORTABLE CHEST 1 VIEW

[AP]
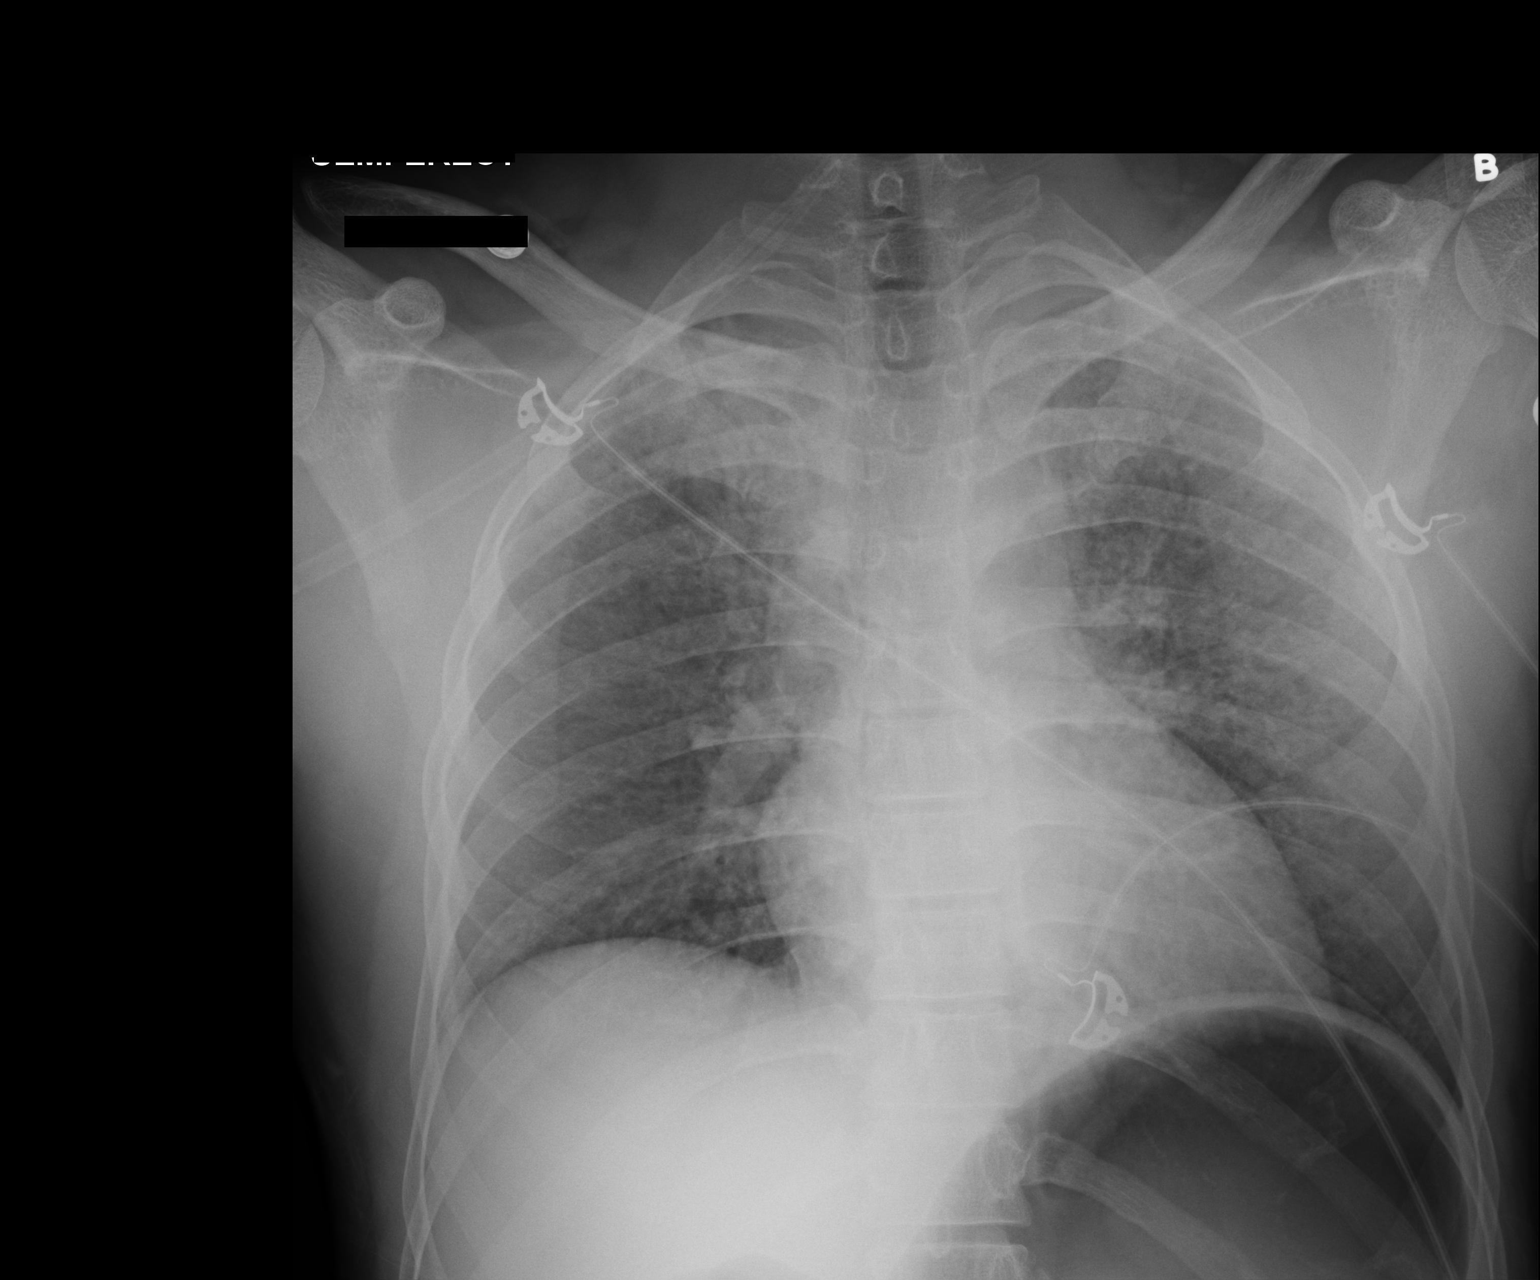

[1 of 1 positions shown; findings below may reference images not displayed]

FINDINGS: The heart size and mediastinal contours are within normal limits.
Pulmonary vascular redistribution consistent pulmonary edema.
Slightly more confluent airspace opacity at the right lung apex
cannot exclude superimposed pneumonia. No acute osseous appearing
abnormality.
IMPRESSION: Diffuse mild-to-moderate pulmonary edema. More confluent pulmonary
consolidation at the right lung apex cannot exclude pneumonia.
Critical Value/emergent results were called by telephone at the time
of interpretation on 09/25/2016 at [DATE] to Dr. NOMASIBULELE MOATSHE , who
verbally acknowledged these results.

## 2018-08-07 DIAGNOSIS — H6612 Chronic tubotympanic suppurative otitis media, left ear: Secondary | ICD-10-CM | POA: Diagnosis not present

## 2018-08-07 DIAGNOSIS — Z7289 Other problems related to lifestyle: Secondary | ICD-10-CM | POA: Diagnosis not present

## 2018-08-07 DIAGNOSIS — Z87891 Personal history of nicotine dependence: Secondary | ICD-10-CM | POA: Diagnosis not present

## 2018-11-13 DIAGNOSIS — Z131 Encounter for screening for diabetes mellitus: Secondary | ICD-10-CM | POA: Diagnosis not present

## 2018-11-13 DIAGNOSIS — Z0000 Encounter for general adult medical examination without abnormal findings: Secondary | ICD-10-CM | POA: Diagnosis not present

## 2018-11-13 DIAGNOSIS — Z136 Encounter for screening for cardiovascular disorders: Secondary | ICD-10-CM | POA: Diagnosis not present

## 2018-11-13 DIAGNOSIS — Z23 Encounter for immunization: Secondary | ICD-10-CM | POA: Diagnosis not present

## 2018-11-14 DIAGNOSIS — Z0000 Encounter for general adult medical examination without abnormal findings: Secondary | ICD-10-CM | POA: Diagnosis not present

## 2018-11-14 DIAGNOSIS — Z136 Encounter for screening for cardiovascular disorders: Secondary | ICD-10-CM | POA: Diagnosis not present

## 2019-11-14 DIAGNOSIS — Z1329 Encounter for screening for other suspected endocrine disorder: Secondary | ICD-10-CM | POA: Diagnosis not present

## 2019-11-14 DIAGNOSIS — Z0000 Encounter for general adult medical examination without abnormal findings: Secondary | ICD-10-CM | POA: Diagnosis not present

## 2019-11-14 DIAGNOSIS — Z136 Encounter for screening for cardiovascular disorders: Secondary | ICD-10-CM | POA: Diagnosis not present

## 2019-11-14 DIAGNOSIS — E559 Vitamin D deficiency, unspecified: Secondary | ICD-10-CM | POA: Diagnosis not present

## 2019-11-17 DIAGNOSIS — E785 Hyperlipidemia, unspecified: Secondary | ICD-10-CM | POA: Diagnosis not present

## 2019-11-17 DIAGNOSIS — E559 Vitamin D deficiency, unspecified: Secondary | ICD-10-CM | POA: Diagnosis not present

## 2019-11-17 DIAGNOSIS — Z23 Encounter for immunization: Secondary | ICD-10-CM | POA: Diagnosis not present

## 2019-11-17 DIAGNOSIS — Z0000 Encounter for general adult medical examination without abnormal findings: Secondary | ICD-10-CM | POA: Diagnosis not present

## 2020-06-11 DIAGNOSIS — D649 Anemia, unspecified: Secondary | ICD-10-CM | POA: Diagnosis not present

## 2020-06-11 DIAGNOSIS — E02 Subclinical iodine-deficiency hypothyroidism: Secondary | ICD-10-CM | POA: Diagnosis not present

## 2020-06-22 DIAGNOSIS — E02 Subclinical iodine-deficiency hypothyroidism: Secondary | ICD-10-CM | POA: Diagnosis not present

## 2020-06-22 DIAGNOSIS — E785 Hyperlipidemia, unspecified: Secondary | ICD-10-CM | POA: Diagnosis not present

## 2020-06-22 DIAGNOSIS — E559 Vitamin D deficiency, unspecified: Secondary | ICD-10-CM | POA: Diagnosis not present

## 2020-06-22 DIAGNOSIS — D649 Anemia, unspecified: Secondary | ICD-10-CM | POA: Diagnosis not present

## 2020-07-26 DIAGNOSIS — D649 Anemia, unspecified: Secondary | ICD-10-CM | POA: Diagnosis not present

## 2020-07-26 DIAGNOSIS — E02 Subclinical iodine-deficiency hypothyroidism: Secondary | ICD-10-CM | POA: Diagnosis not present
